# Patient Record
Sex: Female | Born: 1995 | Race: Black or African American | Hispanic: No | Marital: Single | Smoking: Never smoker
Health system: Southern US, Community
[De-identification: ages and names within clinical notes are randomized; demographics above are authoritative.]

---
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
## 2017-07-13 ENCOUNTER — Other Ambulatory Visit: Payer: Self-pay | Admitting: Nurse Practitioner

## 2017-07-13 ENCOUNTER — Other Ambulatory Visit (HOSPITAL_COMMUNITY)
Admission: RE | Admit: 2017-07-13 | Discharge: 2017-07-13 | Disposition: A | Discharge status: Home / Self Care | Payer: No Typology Code available for payment source | Source: Ambulatory Visit | Attending: Nurse Practitioner | Admitting: Nurse Practitioner

## 2017-07-13 DIAGNOSIS — Z124 Encounter for screening for malignant neoplasm of cervix: Secondary | ICD-10-CM | POA: Insufficient documentation

## 2017-07-17 LAB — CYTOLOGY - PAP
Chlamydia: POSITIVE — AB
Diagnosis: NEGATIVE
HPV: NOT DETECTED
NEISSERIA GONORRHEA: POSITIVE — AB

## 2018-05-13 ENCOUNTER — Other Ambulatory Visit: Payer: Self-pay | Admitting: Nurse Practitioner

## 2018-05-13 ENCOUNTER — Other Ambulatory Visit (HOSPITAL_COMMUNITY)
Admission: RE | Admit: 2018-05-13 | Discharge: 2018-05-13 | Disposition: A | Discharge status: Home / Self Care | Payer: Medicaid Other | Source: Ambulatory Visit | Attending: Nurse Practitioner | Admitting: Nurse Practitioner

## 2018-05-13 DIAGNOSIS — Z1151 Encounter for screening for human papillomavirus (HPV): Secondary | ICD-10-CM | POA: Insufficient documentation

## 2018-05-18 LAB — CYTOLOGY - PAP
Chlamydia: NEGATIVE
Diagnosis: NEGATIVE
HERPES (WINDOWPATH): NEGATIVE
HPV (WINDOPATH): NOT DETECTED
NEISSERIA GONORRHEA: NEGATIVE

## 2018-08-04 ENCOUNTER — Encounter (HOSPITAL_COMMUNITY): Payer: Self-pay | Admitting: Emergency Medicine

## 2018-08-04 ENCOUNTER — Emergency Department (HOSPITAL_COMMUNITY)
Admission: EM | Admit: 2018-08-04 | Discharge: 2018-08-04 | Disposition: A | Discharge status: Home / Self Care | Payer: Self-pay | Attending: Emergency Medicine | Admitting: Emergency Medicine

## 2018-08-04 ENCOUNTER — Emergency Department (HOSPITAL_COMMUNITY): Payer: Self-pay

## 2018-08-04 DIAGNOSIS — Z041 Encounter for examination and observation following transport accident: Secondary | ICD-10-CM | POA: Insufficient documentation

## 2018-08-04 MED ORDER — CYCLOBENZAPRINE HCL 10 MG PO TABS
10.0000 mg | ORAL_TABLET | Freq: Once | ORAL | Status: DC
  Filled 2018-08-04: qty 1

## 2018-08-04 MED ORDER — CYCLOBENZAPRINE HCL 10 MG PO TABS: 10.0000 mg | ORAL_TABLET | Freq: Two times a day (BID) | ORAL | 0 refills | Status: DC | PRN
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
## 2020-12-20 ENCOUNTER — Other Ambulatory Visit: Payer: Medicaid Other

## 2021-04-14 ENCOUNTER — Emergency Department (HOSPITAL_COMMUNITY)
Admission: EM | Admit: 2021-04-14 | Discharge: 2021-04-14 | Disposition: A | Discharge status: Home / Self Care | Payer: Self-pay | Attending: Emergency Medicine | Admitting: Emergency Medicine

## 2021-04-14 DIAGNOSIS — Y908 Blood alcohol level of 240 mg/100 ml or more: Secondary | ICD-10-CM | POA: Insufficient documentation

## 2021-04-14 DIAGNOSIS — F10920 Alcohol use, unspecified with intoxication, uncomplicated: Secondary | ICD-10-CM

## 2021-04-14 DIAGNOSIS — E876 Hypokalemia: Secondary | ICD-10-CM

## 2021-04-14 DIAGNOSIS — F10129 Alcohol abuse with intoxication, unspecified: Secondary | ICD-10-CM | POA: Insufficient documentation

## 2021-04-14 LAB — RAPID URINE DRUG SCREEN, HOSP PERFORMED
Amphetamines: NOT DETECTED
Barbiturates: NOT DETECTED
Benzodiazepines: NOT DETECTED
Cocaine: NOT DETECTED
Opiates: NOT DETECTED
Tetrahydrocannabinol: POSITIVE — AB

## 2021-04-14 LAB — COMPREHENSIVE METABOLIC PANEL
ALT: 14 U/L (ref 0–44)
AST: 16 U/L (ref 15–41)
Albumin: 4.9 g/dL (ref 3.5–5.0)
Alkaline Phosphatase: 56 U/L (ref 38–126)
Anion gap: 10 (ref 5–15)
BUN: 11 mg/dL (ref 6–20)
CO2: 23 mmol/L (ref 22–32)
Calcium: 9.1 mg/dL (ref 8.9–10.3)
Chloride: 109 mmol/L (ref 98–111)
Creatinine, Ser: 0.68 mg/dL (ref 0.44–1.00)
GFR, Estimated: >60 mL/min (ref >60)
Glucose, Bld: 126 mg/dL — ABNORMAL HIGH (ref 70–99)
Potassium: 2.9 mmol/L — ABNORMAL LOW (ref 3.5–5.1)
Sodium: 142 mmol/L (ref 135–145)
Total Bilirubin: 0.3 mg/dL (ref 0.3–1.2)
Total Protein: 8.5 g/dL — ABNORMAL HIGH (ref 6.5–8.1)

## 2021-04-14 LAB — SALICYLATE LEVEL: Salicylate Lvl: <7 mg/dL — ABNORMAL LOW (ref 7.0–30.0)

## 2021-04-14 LAB — CBC WITH DIFFERENTIAL/PLATELET
Abs Immature Granulocytes: 0.01 10*3/uL (ref 0.00–0.07)
Basophils Absolute: 0 10*3/uL (ref 0.0–0.1)
Basophils Relative: 1 %
Eosinophils Absolute: 0 10*3/uL (ref 0.0–0.5)
Eosinophils Relative: 0 %
HCT: 38.8 % (ref 36.0–46.0)
Hemoglobin: 12.7 g/dL (ref 12.0–15.0)
Immature Granulocytes: 0 %
Lymphocytes Relative: 38 %
Lymphs Abs: 1.7 10*3/uL (ref 0.7–4.0)
MCH: 32.4 pg (ref 26.0–34.0)
MCHC: 32.7 g/dL (ref 30.0–36.0)
MCV: 99 fL (ref 80.0–100.0)
Monocytes Absolute: 0.4 10*3/uL (ref 0.1–1.0)
Monocytes Relative: 10 %
Neutro Abs: 2.2 10*3/uL (ref 1.7–7.7)
Neutrophils Relative %: 51 %
Platelets: 235 10*3/uL (ref 150–400)
RBC: 3.92 MIL/uL (ref 3.87–5.11)
RDW: 13.2 % (ref 11.5–15.5)
WBC: 4.4 10*3/uL (ref 4.0–10.5)
nRBC: 0 % (ref 0.0–0.2)

## 2021-04-14 LAB — ETHANOL: Alcohol, Ethyl (B): 304 mg/dL (ref <10)

## 2021-04-14 LAB — ACETAMINOPHEN LEVEL: Acetaminophen (Tylenol), Serum: <10 ug/mL — ABNORMAL LOW (ref 10–30)

## 2021-04-14 LAB — HCG, QUANTITATIVE, PREGNANCY: hCG, Beta Chain, Quant, S: <1 m[IU]/mL (ref <5)

## 2021-04-14 MED ORDER — METOCLOPRAMIDE HCL 5 MG/ML IJ SOLN
10.0000 mg | Freq: Once | INTRAMUSCULAR | Status: AC
  Administered 2021-04-14: 10 mg via INTRAVENOUS
  Filled 2021-04-14: qty 2

## 2021-04-14 MED ORDER — ONDANSETRON HCL 4 MG/2ML IJ SOLN
4.0000 mg | Freq: Once | INTRAMUSCULAR | Status: AC
  Administered 2021-04-14: 4 mg via INTRAVENOUS
  Filled 2021-04-14: qty 2

## 2021-04-14 MED ORDER — ONDANSETRON 4 MG PO TBDP: 4.0000 mg | ORAL_TABLET | Freq: Three times a day (TID) | ORAL | 0 refills | Status: DC | PRN

## 2021-04-14 MED ORDER — POTASSIUM CHLORIDE CRYS ER 20 MEQ PO TBCR
40.0000 meq | EXTENDED_RELEASE_TABLET | Freq: Once | ORAL | Status: AC
  Administered 2021-04-14: 40 meq via ORAL
  Filled 2021-04-14: qty 2

## 2021-04-14 MED ORDER — LACTATED RINGERS IV BOLUS
1000.0000 mL | Freq: Once | INTRAVENOUS | Status: AC
  Administered 2021-04-14: 1000 mL via INTRAVENOUS

## 2021-04-14 MED ORDER — POTASSIUM CHLORIDE ER 10 MEQ PO TBCR: 20.0000 meq | EXTENDED_RELEASE_TABLET | Freq: Two times a day (BID) | ORAL | 0 refills | Status: DC

## 2021-04-14 MED ORDER — NALOXONE HCL 2 MG/2ML IJ SOSY
1.0000 mg | PREFILLED_SYRINGE | Freq: Once | INTRAMUSCULAR | Status: DC
  Filled 2021-04-14: qty 2

## 2021-04-14 MED ORDER — POTASSIUM CHLORIDE 10 MEQ/100ML IV SOLN
10.0000 meq | Freq: Once | INTRAVENOUS | Status: AC
  Administered 2021-04-14: 10 meq via INTRAVENOUS
  Filled 2021-04-14: qty 100

## 2021-04-14 NOTE — ED Notes (Signed)
Pt ambulatory around room. Pt able to get dressed.

## 2021-04-14 NOTE — ED Provider Notes (Signed)
Alexis Friedman is a 25 y.o. female, presenting to the ED with suspected intoxication.   HPI from Sharilyn Sites, PA-C: "Alexis Friedman is a 25 y.o. female.  The history is provided by the patient and medical records.  Drug Overdose   LEVEL V CAVEAT:  INGESTION  25 y.o. F presenting to the ED for suspected overdose.  She was [place] sidewalk [place].  Unknown ingestion substance or time.  She became apneic with EMS and responded well to 1mg  narcan.  Has been dry heaving en route.  Still very sleepy but is fighting against ED staff when hooking up to monitoring.  No further history available."      Physical Exam  BP 107/79   Pulse 84   Temp (!) 97.5 F (36.4 C) (Oral)   Resp 11   Ht 5\' 6"  (1.676 m)   Wt 67 kg   SpO2 100%   BMI 23.84 kg/m   Physical Exam Vitals and nursing note reviewed.  Constitutional:      General: She is not in acute distress.    Appearance: She is well-developed. She is not diaphoretic.  HENT:     Head: Normocephalic and atraumatic.     Mouth/Throat:     Mouth: Mucous membranes are moist.     Pharynx: Oropharynx is clear.  Eyes:     Conjunctiva/sclera: Conjunctivae normal.  Cardiovascular:     Rate and Rhythm: Normal rate and regular rhythm.     Pulses: Normal pulses.          Radial pulses are 2+ on the right side and 2+ on the left side.       Posterior tibial pulses are 2+ on the right side and 2+ on the left side.     Heart sounds: Normal heart sounds.     Comments: Tactile temperature in the extremities appropriate and equal bilaterally. Pulmonary:     Effort: Pulmonary effort is normal. No respiratory distress.     Breath sounds: Normal breath sounds.  Abdominal:     Palpations: Abdomen is soft.     Tenderness: There is no abdominal tenderness. There is no guarding.  Musculoskeletal:     Cervical back: Neck supple.     Right lower leg: No edema.     Left lower leg: No edema.     Comments: Normal  motor function intact in all extremities. No midline spinal tenderness.   Skin:    General: Skin is warm and dry.  Neurological:     Mental Status: She is alert and oriented to person, place, and time.     Comments: No noted acute cognitive deficit. Sensation grossly intact to light touch in the extremities.   Grip strengths equal bilaterally.   Strength 5/5 in all extremities.  No gait disturbance.  Coordination intact.  Cranial nerves III-XII grossly intact.  Handles oral secretions without noted difficulty.  No noted phonation or speech deficit. No facial droop.   Psychiatric:        Mood and Affect: Mood and affect normal.        Speech: Speech normal.        Behavior: Behavior normal.     ED Course/Procedures     Procedures   Abnormal Labs Reviewed  COMPREHENSIVE METABOLIC PANEL - Abnormal; Notable for the following components:      Result Value   Potassium 2.9 (*)    Glucose, Bld 126 (*)    Total  Protein 8.5 (*)    All other components within normal limits  ETHANOL - Abnormal; Notable for the following components:   Alcohol, Ethyl (B) 304 (*)    All other components within normal limits  RAPID URINE DRUG SCREEN, HOSP PERFORMED - Abnormal; Notable for the following components:   Tetrahydrocannabinol POSITIVE (*)    All other components within normal limits  SALICYLATE LEVEL - Abnormal; Notable for the following components:   Salicylate Lvl <7.0 (*)    All other components within normal limits  ACETAMINOPHEN LEVEL - Abnormal; Notable for the following components:   Acetaminophen (Tylenol), Serum <10 (*)    All other components within normal limits     EKG Interpretation  Date/Time:  Sunday Apr 14 2021 03:45:20 EDT Ventricular Rate:  66 PR Interval:  131 QRS Duration: 93 QT Interval:  394 QTC Calculation: 413 R Axis:   79 Text Interpretation: Sinus rhythm Baseline wander in lead(s) V4 V5 Partial missing lead(s): V4 Confirmed by Drema Pry [phone]) on  04/14/2021 6:28:10 AM       MDM    Clinical Course as of 04/14/21 0959  Sun Apr 14, 2021  0715 Patient is awake and able to follow commands for necessary exams.  We will continue to observe the patient as well as hydrate and replenish potassium. [SJ]    Clinical Course User Index [SJ] Anselm Pancoast, PA-C   Patient care handoff report received from Sharilyn Sites, PA-C. Plan: Reevaluate patient.  Discharge when clinically sober.  Patient presents with suspected intoxication.  I was able to obtain more details from patient's friend at the bedside.  He states they were drinking alcohol into early this morning.  States it would not be unusual for one of their group to "get blackout drunk."  She said that she had had too much to drink.  They were going to just take her home, however, because they were inside the gentleman[place] club and she was not able to walk under her own power, the staff called EMS.  They did not note any seizure activity. They deny any drug use beyond marijuana last night.  They deny any known trauma. Upon my initial evaluation, patient was sleeping without evidence of respiratory depression. Once she awoke, she was alert and oriented x4.  She has no focal neurologic deficits.  I did not find any evidence of injuries.  She has no complaints other than feeling tired. Patient noted to have hypokalemia.  We will replenish some here and have patient continue at home. Able to ambulate and tolerate p.o. prior to discharge.     Vitals:   04/14/21 0800 04/14/21 0835 04/14/21 0845 04/14/21 0905  BP: (!) 124/94 123/81  109/70  Pulse: 88 86 79 77  Resp: 12 (!) 31 (!) 21 16  Temp:      TempSrc:      SpO2: 100% 100% 100% 100%  Weight:      Height:          Anselm Pancoast, PA-C 04/14/21 1006    Lorre Nick, MD 04/16/21 [phone]

## 2021-04-14 NOTE — ED Notes (Addendum)
Pt visitor at bedside. Pt alert but tearful. Pt cooperative with care. Water given to patient. This RN and visitor trying to console and comfort patient.

## 2021-04-14 NOTE — Discharge Instructions (Addendum)
Drink responsibly, avoid illicit drugs.  Hydration: Symptoms of most illnesses will be intensified and complicated by dehydration. Dehydration can also extend the duration of symptoms. Drink plenty of fluids and get plenty of rest. You should be drinking at least half a liter of water an hour to stay hydrated. Electrolyte drinks (ex. Gatorade, Powerade, Pedialyte) are also encouraged. You should be drinking enough fluids to make your urine light yellow, almost clear. If this is not the case, you are not drinking enough water.  Your potassium was lower than normal.  Please take the potassium supplementation.  Take this medication with food to avoid upset stomach.  Follow up with a primary care provider for retesting of this potassium level.  Nausea/vomiting: Use the ondansetron (generic for Zofran) for nausea or vomiting.  This medication may not prevent all vomiting or nausea, but can help facilitate better hydration. Things that can help with nausea/vomiting also include peppermint/menthol candies, vitamin B12, and ginger.

## 2021-04-14 NOTE — ED Triage Notes (Signed)
Pt found [place] the parking lot of a local club unresponsive and apneic, pinpoint pupils. Given 1mg  Narcan by EMS. Pt currently arousable by sternal rub and breathing independently.

## 2021-04-14 NOTE — ED Provider Notes (Signed)
[facility]-EMERGENCY DEPT Provider Note   CSN: [id number] Arrival date & time: 04/14/21  0300     History Chief Complaint  Patient presents with  . Drug Overdose    Alexis Friedman is a 25 y.o. female.  The history is provided by the patient and medical records.  Drug Overdose   LEVEL V CAVEAT:  INGESTION  25 y.o. F presenting to the ED for suspected overdose.  She was found [place] on sidewalk [place] of [place].  Unknown ingestion substance or time.  She became apneic with EMS and responded well to 1mg  narcan.  Has been dry heaving en route.  Still very sleepy but is fighting against ED staff when hooking up to monitoring.  No further history available.  No past medical history on file.  There are no problems to display for this patient.   No past surgical history on file.   OB History   No obstetric history on file.     No family history on file.  Social History   Tobacco Use  . Smoking status: Never Smoker  . Smokeless tobacco: Never Used  Substance Use Topics  . Alcohol use: No  . Drug use: Never    Home Medications Prior to Admission medications   Medication Sig Start Date End Date Taking? Authorizing Provider  amoxicillin (AMOXIL) 500 MG capsule Take 1 capsule (500 mg total) by mouth 3 (three) times daily. 12/23/14   02/21/15, PA-C  cyclobenzaprine (FLEXERIL) 10 MG tablet Take 1 tablet (10 mg total) by mouth 2 (two) times daily as needed for muscle spasms. 08/04/18   Tegeler, 08/06/18, MD  dextromethorphan (DELSYM) 30 MG/5ML liquid Take 5 mLs (30 mg total) by mouth as needed for cough. 12/23/14   02/21/15, PA-C    Allergies    Patient has no known allergies.  Review of Systems   Review of Systems  Unable to perform ROS: Other    Physical Exam Updated Vital Signs BP 111/73   Pulse 76   Temp (!) 97.5 F (36.4 C) (Oral)   Resp 15   Ht 5\' 6"  (1.676 m)   Wt 67 kg   SpO2 100%   BMI 23.84  kg/m   Physical Exam Vitals and nursing note reviewed.  Constitutional:      Appearance: She is well-developed.     Comments: Drowsy but intermittently arousable, dry heaving  HENT:     Head: Normocephalic and atraumatic.  Eyes:     Conjunctiva/sclera: Conjunctivae normal.     Pupils: Pupils are equal, round, and reactive to light.  Cardiovascular:     Rate and Rhythm: Normal rate and regular rhythm.     Heart sounds: Normal heart sounds.  Pulmonary:     Effort: Pulmonary effort is normal.     Breath sounds: Normal breath sounds.  Abdominal:     General: Bowel sounds are normal.     Palpations: Abdomen is soft.  Musculoskeletal:        General: Normal range of motion.     Cervical back: Normal range of motion.  Skin:    General: Skin is warm and dry.  Neurological:     Mental Status: She is oriented to person, place, and time.     ED Results / Procedures / Treatments   Labs (all labs ordered are listed, but only abnormal results are displayed) Labs Reviewed  COMPREHENSIVE METABOLIC PANEL - Abnormal; Notable for the following components:  Result Value   Potassium 2.9 (*)    Glucose, Bld 126 (*)    Total Protein 8.5 (*)    All other components within normal limits  ETHANOL - Abnormal; Notable for the following components:   Alcohol, Ethyl (B) 304 (*)    All other components within normal limits  RAPID URINE DRUG SCREEN, HOSP PERFORMED - Abnormal; Notable for the following components:   Tetrahydrocannabinol POSITIVE (*)    All other components within normal limits  SALICYLATE LEVEL - Abnormal; Notable for the following components:   Salicylate Lvl <7.0 (*)    All other components within normal limits  ACETAMINOPHEN LEVEL - Abnormal; Notable for the following components:   Acetaminophen (Tylenol), Serum <10 (*)    All other components within normal limits  CBC WITH DIFFERENTIAL/PLATELET  HCG, QUANTITATIVE, PREGNANCY    EKG None  Radiology No results  found.  Procedures Procedures   Medications Ordered in ED Medications  ondansetron (ZOFRAN) injection 4 mg (4 mg Intravenous Given 04/14/21 0317)    ED Course  I have reviewed the triage vital signs and the nursing notes.  Pertinent labs & imaging results that were available during my care of the patient were reviewed by me and considered in my medical decision making (see chart for details).    MDM Rules/Calculators/A&P  25 y.o. F here after suspected overdose.  [place] sidewalk [place].  Unknown ingestion substance/time.  Became apneic with EMS but responded well to narcan.  She is drowsy but arousable on arrival to ED.  Maintaining airway with stable vitals.  Suspect opiate ingestion given response to narcan.  She is dry heaving here, given zofran.  Will monitor until she metabolizes.  3:45 AM Called back into room as reported patient's breathing "slowed again".  Went to reassess, she is fidgeting in bed, VS remain stable on RA.  She has appropriate spontaneous respirations.  Does not appear to need further narcan right now but we will send labs and obtain EKG.  PRN narcan order placed and on hold.  Will continue to monitor closely.  Labs as above-- ethanol 304.  uds + for THC.  Still highly intoxicated at this time.  Will need to monitor until clinically sober and can ambulate, tolerate PO, etc.  Care will be signed out to oncoming provider pending sobriety.  Anticipate discharge.  Final Clinical Impression(s) / ED Diagnoses Final diagnoses:  Alcoholic intoxication without complication [facility]    Rx / DC Orders ED Discharge Orders    None       Garlon Hatchet, PA-C 04/14/21 0629    Nira Conn, MD 04/14/21 [phone]

## 2022-07-01 ENCOUNTER — Other Ambulatory Visit: Payer: Self-pay

## 2022-07-01 ENCOUNTER — Encounter: Payer: Self-pay | Admitting: Student

## 2022-07-01 ENCOUNTER — Ambulatory Visit (INDEPENDENT_AMBULATORY_CARE_PROVIDER_SITE_OTHER): Payer: Self-pay | Admitting: Student

## 2022-07-01 VITALS — BP 121/79 | HR 67 | Temp 98.1°F | Ht 68.0 in | Wt 160.0 lb

## 2022-07-01 DIAGNOSIS — Z789 Other specified health status: Secondary | ICD-10-CM

## 2022-07-01 DIAGNOSIS — Z803 Family history of malignant neoplasm of breast: Secondary | ICD-10-CM

## 2022-07-01 DIAGNOSIS — Z7251 High risk heterosexual behavior: Secondary | ICD-10-CM

## 2022-07-01 DIAGNOSIS — Z0000 Encounter for general adult medical examination without abnormal findings: Secondary | ICD-10-CM | POA: Insufficient documentation

## 2022-07-01 NOTE — Progress Notes (Signed)
CC: Establish PCP and physical  HPI:  Alexis Friedman is a 26 y.o. with no known medical history who present to the clinic for establish PCP and physical.  Patient has no medical history and currently only takes Zyrtec for her allergies.  Family history: -History of breast cancer in her mother, aunts and grandmother.  Mother was diagnosed with breast cancer in 2007.  She may have BRCA gene mutation. -History of hypertension in all family members.  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  No IV drug use  Surgery: -Remote left toe surgery  Please see problem based charting for detail  Review of Systems:  per HPI  Physical Exam:  Vitals:   07/01/22 0921  BP: 121/79  Pulse: 67  Temp: 98.1 F (36.7 C)  TempSrc: Oral  SpO2: 100%  Weight: 160 lb (72.6 kg)  Height: _0  (1.727 m)   Physical Exam Constitutional:      General: She is not in acute distress.    Appearance: She is not ill-appearing.  HENT:     Head: Normocephalic and atraumatic.     Right Ear: Tympanic membrane, ear canal and external ear normal. There is no impacted cerumen.     Left Ear: Tympanic membrane, ear canal and external ear normal. There is no impacted cerumen.     Nose: Nose normal. No congestion or rhinorrhea.     Mouth/Throat:     Mouth: Mucous membranes are moist.     Pharynx: No oropharyngeal exudate or posterior oropharyngeal erythema.  Eyes:     General:        Right eye: No discharge.        Left eye: No discharge.     Conjunctiva/sclera: Conjunctivae normal.  Cardiovascular:     Rate and Rhythm: Normal rate and regular rhythm.     Pulses: Normal pulses.     Heart sounds: Normal heart sounds.  Pulmonary:     Effort: Pulmonary effort is normal. No respiratory distress.     Breath sounds: Normal breath sounds. No wheezing.  Abdominal:     General:  Bowel sounds are normal. There is no distension.     Palpations: Abdomen is soft.     Tenderness: There is no abdominal tenderness.  Musculoskeletal:        General: No deformity or signs of injury. Normal range of motion.     Cervical back: Normal range of motion.     Right lower leg: No edema.     Left lower leg: No edema.  Skin:    General: Skin is warm.  Neurological:     General: No focal deficit present.     Mental Status: She is alert and oriented to person, place, and time.  Psychiatric:        Mood and Affect: Mood normal.        Behavior: Behavior normal.      Assessment & Plan:   See Encounters Tab for problem based charting.  Family history of breast cancer Patient reports family history of breast cancer in her mother, aunt and grandma.  Her mother was diagnosed with breast cancer in 2007 and tested positive for BRCA genes.  Patient has never been tested for BRCA genes due to insurance issue.  She had a breast ultrasound done in 2014 which was negative for any malignancy.  It was recommended  that patient will get an annual mammogram when she turns 36.  Sooner if symptomatic.  Currently patient is uninsured so defer genetic testing.  Health care maintenance - Obtain HIV for history of unprotected intercourse -Hep C screening -Patient will need a Pap smear.  Her last Pap was in 2018.  However patient currently uninsured.  Provide financial assistance program packet.  Hopefully she will obtain some coverage for next visit   Patient discussed with Dr.  Saverio Danker

## 2022-07-01 NOTE — Patient Instructions (Signed)
Alexis Friedman  It was nice seeing you in the clinic today.  Here is a summary what we talked about;  1.  Your physical exam was normal.  2.  Given your family history of breast cancer, it is recommended that you will get a yearly mammogram when you turn 36.  3.  Please applied for the financial assistance program which can help cover your Pap smear.  4.  We will check for HIV and hepatitis C today  Please return in 6 months, sooner if you are approved for the program.  Take care  Dr. Cyndie Chime

## 2022-07-01 NOTE — Assessment & Plan Note (Signed)
-   Obtain HIV for history of unprotected intercourse -Hep C screening -Patient will need a Pap smear.  Her last Pap was in 2018.  However patient currently uninsured.  Provide financial assistance program packet.  Hopefully she will obtain some coverage for next visit

## 2022-07-01 NOTE — Assessment & Plan Note (Addendum)
Patient reports family history of breast cancer in her mother, aunt and grandma.  Her mother was diagnosed with breast cancer in 2007 and tested positive for BRCA genes.  Patient has never been tested for BRCA genes due to insurance issue.  She had a breast ultrasound done in 2014 which was negative for any malignancy.  It was recommended that patient will get an annual mammogram when she turns 36.  Sooner if symptomatic.  Currently patient is uninsured so defer genetic testing.

## 2022-07-02 LAB — HEPATITIS C ANTIBODY: Hep C Virus Ab: NONREACTIVE

## 2022-07-02 LAB — HIV ANTIBODY (ROUTINE TESTING W REFLEX): HIV Screen 4th Generation wRfx: NONREACTIVE

## 2022-07-02 NOTE — Progress Notes (Signed)
Internal Medicine Clinic Attending  Case discussed with Dr. Alfonse Spruce  At the time of the visit.  We reviewed the resident's history and exam and pertinent patient test results.  I agree with the assessment, diagnosis, and plan of care documented in the resident's note. Financial assistance/coverage paperwork provided by Dr. Alfonse Spruce to the patient today. We discussed future genetic testing given family history of breast cancer and BRCA mutation. Dr. Alfonse Spruce discussed cost of Pap smear in clinic today without insurance and will readdress screening at f/u visit once coverage is obtained.

## 2023-05-11 ENCOUNTER — Encounter: Payer: Self-pay | Admitting: *Deleted
